# Patient Record
Sex: Male | Born: 1935 | Race: Black or African American | Hispanic: No | Marital: Single
Health system: Southern US, Community
[De-identification: ages and names within clinical notes are randomized; demographics above are authoritative.]

---

## 2012-03-28 ENCOUNTER — Inpatient Hospital Stay: Payer: Self-pay | Admitting: Internal Medicine

## 2012-03-28 LAB — BASIC METABOLIC PANEL
BUN: 27 mg/dL — ABNORMAL HIGH (ref 7–18)
Calcium, Total: 9.5 mg/dL (ref 8.5–10.1)
Chloride: 106 mmol/L (ref 98–107)
Co2: 30 mmol/L (ref 21–32)
EGFR (African American): 41 — ABNORMAL LOW
EGFR (Non-African Amer.): 35 — ABNORMAL LOW
Osmolality: 291 (ref 275–301)

## 2012-03-28 LAB — HEPATIC FUNCTION PANEL A (ARMC)
Albumin: 3.4 g/dL (ref 3.4–5.0)
Alkaline Phosphatase: 94 U/L (ref 50–136)
Bilirubin, Direct: 0.2 mg/dL (ref 0.00–0.20)
SGOT(AST): 74 U/L — ABNORMAL HIGH (ref 15–37)
SGPT (ALT): 101 U/L — ABNORMAL HIGH
Total Protein: 7.1 g/dL (ref 6.4–8.2)

## 2012-03-28 LAB — CBC
HGB: 14.3 g/dL (ref 13.0–18.0)
MCV: 97 fL (ref 80–100)
WBC: 4.7 10*3/uL (ref 3.8–10.6)

## 2012-03-28 LAB — CK TOTAL AND CKMB (NOT AT ARMC)
CK, Total: 121 U/L (ref 35–232)
CK-MB: 4.6 ng/mL — ABNORMAL HIGH (ref 0.5–3.6)

## 2012-03-28 LAB — TROPONIN I
Troponin-I: 0.3 ng/mL — ABNORMAL HIGH
Troponin-I: 0.31 ng/mL — ABNORMAL HIGH

## 2012-03-28 LAB — PRO B NATRIURETIC PEPTIDE: B-Type Natriuretic Peptide: 14664 pg/mL — ABNORMAL HIGH (ref 0–450)

## 2012-03-29 LAB — BASIC METABOLIC PANEL
BUN: 27 mg/dL — ABNORMAL HIGH (ref 7–18)
Calcium, Total: 8.8 mg/dL (ref 8.5–10.1)
Chloride: 107 mmol/L (ref 98–107)
Creatinine: 1.84 mg/dL — ABNORMAL HIGH (ref 0.60–1.30)
Glucose: 102 mg/dL — ABNORMAL HIGH (ref 65–99)
Osmolality: 292 (ref 275–301)
Potassium: 3.7 mmol/L (ref 3.5–5.1)
Sodium: 144 mmol/L (ref 136–145)

## 2012-03-29 LAB — CBC WITH DIFFERENTIAL/PLATELET
Basophil %: 1.5 %
Eosinophil #: 0.3 10*3/uL (ref 0.0–0.7)
Eosinophil %: 5.7 %
HCT: 42.8 % (ref 40.0–52.0)
HGB: 13.9 g/dL (ref 13.0–18.0)
Lymphocyte #: 1.2 10*3/uL (ref 1.0–3.6)
MCH: 31.7 pg (ref 26.0–34.0)
MCHC: 32.5 g/dL (ref 32.0–36.0)
Monocyte #: 0.5 x10 3/mm (ref 0.2–1.0)
Monocyte %: 11.6 %
Neutrophil #: 2.5 10*3/uL (ref 1.4–6.5)
Neutrophil %: 54.5 %
Platelet: 135 10*3/uL — ABNORMAL LOW (ref 150–440)
RBC: 4.39 10*6/uL — ABNORMAL LOW (ref 4.40–5.90)

## 2012-03-29 LAB — CK TOTAL AND CKMB (NOT AT ARMC)
CK, Total: 89 U/L (ref 35–232)
CK-MB: 3.6 ng/mL (ref 0.5–3.6)

## 2012-03-29 LAB — TROPONIN I: Troponin-I: 0.28 ng/mL — ABNORMAL HIGH

## 2012-03-30 LAB — BASIC METABOLIC PANEL
Anion Gap: 8 (ref 7–16)
Co2: 31 mmol/L (ref 21–32)
EGFR (African American): 43 — ABNORMAL LOW
Osmolality: 291 (ref 275–301)
Potassium: 3.5 mmol/L (ref 3.5–5.1)

## 2012-10-18 ENCOUNTER — Ambulatory Visit: Payer: Self-pay | Admitting: Internal Medicine

## 2012-10-19 LAB — CBC
HCT: 45.3 % (ref 40.0–52.0)
HGB: 15.2 g/dL (ref 13.0–18.0)
MCH: 32.7 pg (ref 26.0–34.0)
MCHC: 33.6 g/dL (ref 32.0–36.0)
MCV: 97 fL (ref 80–100)
Platelet: 115 10*3/uL — ABNORMAL LOW (ref 150–440)
RBC: 4.65 10*6/uL (ref 4.40–5.90)
RDW: 16.6 % — ABNORMAL HIGH (ref 11.5–14.5)
WBC: 3.5 10*3/uL — ABNORMAL LOW (ref 3.8–10.6)

## 2012-10-19 LAB — CK TOTAL AND CKMB (NOT AT ARMC): CK-MB: 11.3 ng/mL — ABNORMAL HIGH (ref 0.5–3.6)

## 2012-10-19 LAB — COMPREHENSIVE METABOLIC PANEL
Albumin: 3.1 g/dL — ABNORMAL LOW (ref 3.4–5.0)
Alkaline Phosphatase: 107 U/L (ref 50–136)
Anion Gap: 7 (ref 7–16)
Bilirubin,Total: 0.9 mg/dL (ref 0.2–1.0)
Calcium, Total: 8.9 mg/dL (ref 8.5–10.1)
Chloride: 105 mmol/L (ref 98–107)
Co2: 26 mmol/L (ref 21–32)
EGFR (African American): 33 — ABNORMAL LOW
Osmolality: 286 (ref 275–301)
Potassium: 4.5 mmol/L (ref 3.5–5.1)
SGPT (ALT): 84 U/L — ABNORMAL HIGH (ref 12–78)
Sodium: 138 mmol/L (ref 136–145)
Total Protein: 6.2 g/dL — ABNORMAL LOW (ref 6.4–8.2)

## 2012-10-19 LAB — PRO B NATRIURETIC PEPTIDE: B-Type Natriuretic Peptide: 19878 pg/mL — ABNORMAL HIGH (ref 0–450)

## 2012-10-19 LAB — TROPONIN I: Troponin-I: 0.09 ng/mL — ABNORMAL HIGH

## 2012-10-20 ENCOUNTER — Inpatient Hospital Stay: Payer: Self-pay | Admitting: Specialist

## 2012-10-20 LAB — BASIC METABOLIC PANEL
BUN: 31 mg/dL — ABNORMAL HIGH (ref 7–18)
Calcium, Total: 8.4 mg/dL — ABNORMAL LOW (ref 8.5–10.1)
Chloride: 108 mmol/L — ABNORMAL HIGH (ref 98–107)
Co2: 25 mmol/L (ref 21–32)
Creatinine: 1.99 mg/dL — ABNORMAL HIGH (ref 0.60–1.30)
Glucose: 178 mg/dL — ABNORMAL HIGH (ref 65–99)
Sodium: 141 mmol/L (ref 136–145)

## 2012-10-20 LAB — PROTIME-INR
INR: 2.2
Prothrombin Time: 24.3 secs — ABNORMAL HIGH (ref 11.5–14.7)

## 2012-10-20 LAB — TROPONIN I: Troponin-I: 0.09 ng/mL — ABNORMAL HIGH

## 2012-10-21 LAB — COMPREHENSIVE METABOLIC PANEL
Alkaline Phosphatase: 79 U/L (ref 50–136)
BUN: 32 mg/dL — ABNORMAL HIGH (ref 7–18)
Bilirubin,Total: 0.7 mg/dL (ref 0.2–1.0)
Calcium, Total: 8.7 mg/dL (ref 8.5–10.1)
Chloride: 109 mmol/L — ABNORMAL HIGH (ref 98–107)
Co2: 26 mmol/L (ref 21–32)
Creatinine: 1.91 mg/dL — ABNORMAL HIGH (ref 0.60–1.30)
EGFR (African American): 39 — ABNORMAL LOW
EGFR (Non-African Amer.): 33 — ABNORMAL LOW
Glucose: 102 mg/dL — ABNORMAL HIGH (ref 65–99)
Osmolality: 290 (ref 275–301)
Total Protein: 5.1 g/dL — ABNORMAL LOW (ref 6.4–8.2)

## 2012-10-22 LAB — BASIC METABOLIC PANEL
Anion Gap: 7 (ref 7–16)
BUN: 31 mg/dL — ABNORMAL HIGH (ref 7–18)
Calcium, Total: 8.6 mg/dL (ref 8.5–10.1)
Chloride: 107 mmol/L (ref 98–107)
Creatinine: 1.92 mg/dL — ABNORMAL HIGH (ref 0.60–1.30)
EGFR (African American): 38 — ABNORMAL LOW
Osmolality: 290 (ref 275–301)
Sodium: 142 mmol/L (ref 136–145)

## 2012-10-22 LAB — PROTIME-INR
INR: 2.6
Prothrombin Time: 28.1 secs — ABNORMAL HIGH (ref 11.5–14.7)

## 2012-10-23 LAB — BASIC METABOLIC PANEL
Anion Gap: 6 — ABNORMAL LOW (ref 7–16)
Creatinine: 1.98 mg/dL — ABNORMAL HIGH (ref 0.60–1.30)
EGFR (African American): 37 — ABNORMAL LOW
EGFR (Non-African Amer.): 32 — ABNORMAL LOW
Potassium: 4.8 mmol/L (ref 3.5–5.1)

## 2012-10-23 LAB — PROTIME-INR: Prothrombin Time: 25 secs — ABNORMAL HIGH (ref 11.5–14.7)

## 2012-10-29 ENCOUNTER — Inpatient Hospital Stay: Payer: Self-pay | Admitting: Internal Medicine

## 2012-10-29 LAB — CBC WITH DIFFERENTIAL/PLATELET
Basophil #: 0 10*3/uL (ref 0.0–0.1)
Basophil %: 0.6 %
Eosinophil %: 1.1 %
HCT: 48.6 % (ref 40.0–52.0)
HGB: 16.1 g/dL (ref 13.0–18.0)
Lymphocyte #: 0.6 10*3/uL — ABNORMAL LOW (ref 1.0–3.6)
Lymphocyte %: 16.7 %
MCH: 32.2 pg (ref 26.0–34.0)
MCHC: 33.2 g/dL (ref 32.0–36.0)
MCV: 97 fL (ref 80–100)
Neutrophil #: 2.7 10*3/uL (ref 1.4–6.5)
Platelet: 105 10*3/uL — ABNORMAL LOW (ref 150–440)
RBC: 5.01 10*6/uL (ref 4.40–5.90)
WBC: 3.9 10*3/uL (ref 3.8–10.6)

## 2012-10-29 LAB — COMPREHENSIVE METABOLIC PANEL
Albumin: 3.6 g/dL (ref 3.4–5.0)
Alkaline Phosphatase: 124 U/L (ref 50–136)
Anion Gap: 10 (ref 7–16)
BUN: 43 mg/dL — ABNORMAL HIGH (ref 7–18)
Bilirubin,Total: 1.8 mg/dL — ABNORMAL HIGH (ref 0.2–1.0)
Chloride: 99 mmol/L (ref 98–107)
Creatinine: 2.74 mg/dL — ABNORMAL HIGH (ref 0.60–1.30)
EGFR (African American): 25 — ABNORMAL LOW
Glucose: 139 mg/dL — ABNORMAL HIGH (ref 65–99)
SGOT(AST): 182 U/L — ABNORMAL HIGH (ref 15–37)
Sodium: 134 mmol/L — ABNORMAL LOW (ref 136–145)
Total Protein: 7 g/dL (ref 6.4–8.2)

## 2012-10-29 LAB — CK TOTAL AND CKMB (NOT AT ARMC)
CK, Total: 1312 U/L — ABNORMAL HIGH (ref 35–232)
CK-MB: 17 ng/mL — ABNORMAL HIGH (ref 0.5–3.6)

## 2012-10-29 LAB — TROPONIN I: Troponin-I: 1.2 ng/mL — ABNORMAL HIGH

## 2012-10-29 LAB — PROTIME-INR: Prothrombin Time: 41.5 secs — ABNORMAL HIGH (ref 11.5–14.7)

## 2012-10-30 LAB — CBC WITH DIFFERENTIAL/PLATELET
Basophil %: 0.6 %
HCT: 43.5 % (ref 40.0–52.0)
HGB: 14.7 g/dL (ref 13.0–18.0)
Lymphocyte #: 0.7 10*3/uL — ABNORMAL LOW (ref 1.0–3.6)
Lymphocyte %: 20.1 %
MCHC: 33.7 g/dL (ref 32.0–36.0)
MCV: 97 fL (ref 80–100)
Monocyte %: 17.5 %
Neutrophil #: 2.2 10*3/uL (ref 1.4–6.5)
Neutrophil %: 61.1 %
RBC: 4.51 10*6/uL (ref 4.40–5.90)
RDW: 16.3 % — ABNORMAL HIGH (ref 11.5–14.5)
WBC: 3.6 10*3/uL — ABNORMAL LOW (ref 3.8–10.6)

## 2012-10-30 LAB — BASIC METABOLIC PANEL
Calcium, Total: 9.1 mg/dL (ref 8.5–10.1)
Chloride: 101 mmol/L (ref 98–107)
EGFR (African American): 25 — ABNORMAL LOW
EGFR (Non-African Amer.): 22 — ABNORMAL LOW
Glucose: 112 mg/dL — ABNORMAL HIGH (ref 65–99)
Potassium: 4.5 mmol/L (ref 3.5–5.1)
Sodium: 136 mmol/L (ref 136–145)

## 2012-10-30 LAB — CK TOTAL AND CKMB (NOT AT ARMC): CK-MB: 13.7 ng/mL — ABNORMAL HIGH (ref 0.5–3.6)

## 2012-10-30 LAB — PROTIME-INR: INR: 4.4

## 2012-10-31 LAB — BASIC METABOLIC PANEL
EGFR (African American): 26 — ABNORMAL LOW
EGFR (Non-African Amer.): 22 — ABNORMAL LOW
Potassium: 4.1 mmol/L (ref 3.5–5.1)
Sodium: 138 mmol/L (ref 136–145)

## 2012-10-31 LAB — DIGOXIN LEVEL: Digoxin: 0.83 ng/mL

## 2012-10-31 LAB — PROTIME-INR: Prothrombin Time: 44.6 secs — ABNORMAL HIGH (ref 11.5–14.7)

## 2012-11-04 LAB — CULTURE, BLOOD (SINGLE)

## 2012-11-18 ENCOUNTER — Ambulatory Visit: Payer: Self-pay | Admitting: Hospice and Palliative Medicine

## 2012-11-18 DEATH — deceased

## 2013-09-28 IMAGING — US ABDOMEN ULTRASOUND LIMITED
1 series · 13 of 25 positions shown · non-contrast
Comparison: none

REASON FOR EXAM: elevated transaminases
COMMENTS:   Body Site: GB and Fossa, CBD, Head of Pancreas; Right Upper Quad

PROCEDURE:     US  - US ABDOMEN LIMITED SURVEY  - October 21, 2012  [DATE]
RESULT:     Limited right upper quadrant abdominal ultrasound dated 10/21/2012.

[Series 1: abdomen ultrasound limited · 0.21mm/px · 13 of 43 slices shown]
[im 1/43]
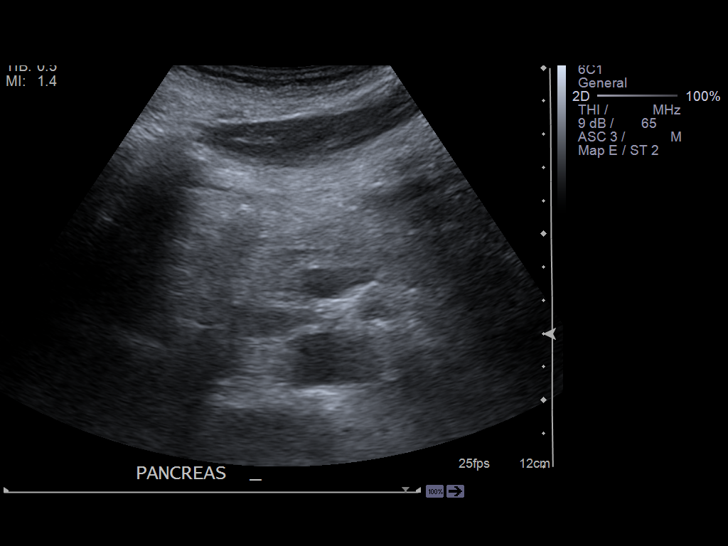
[im 4/43]
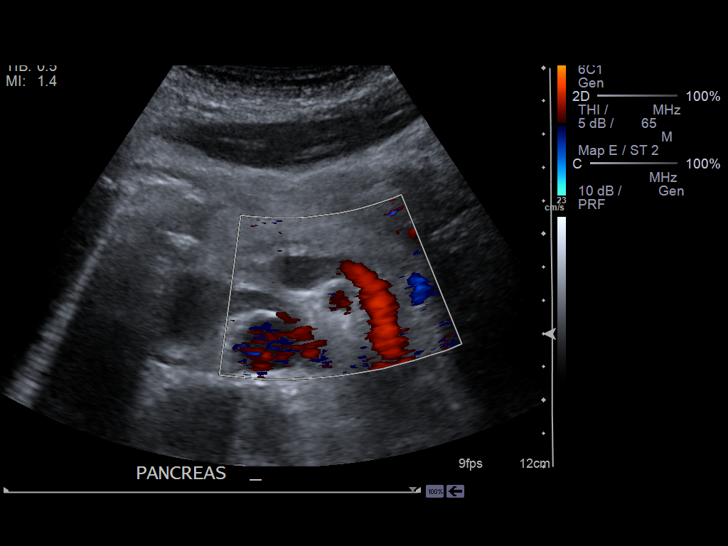
[im 8/43]
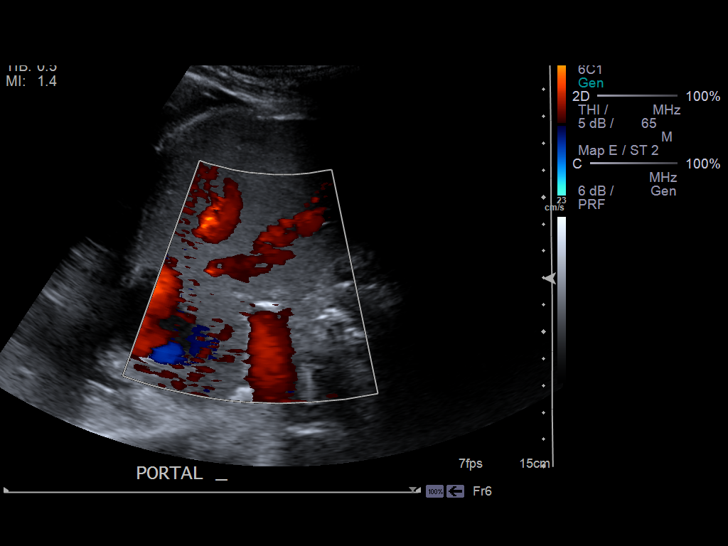
[im 11/43]
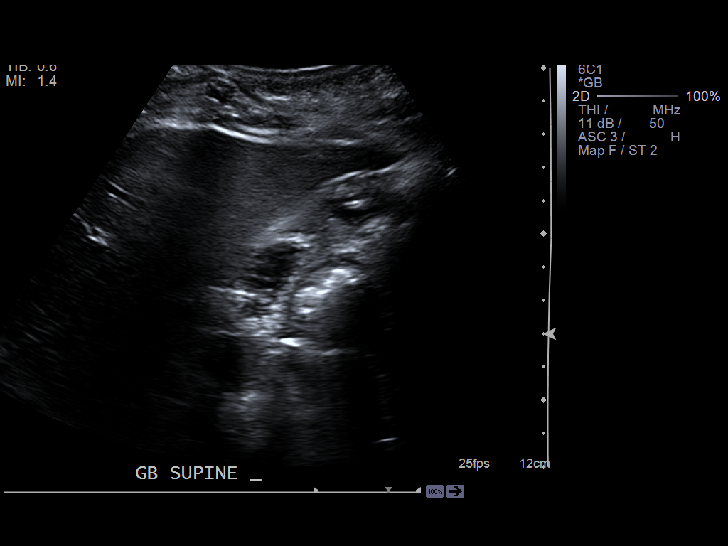
[im 15/43]
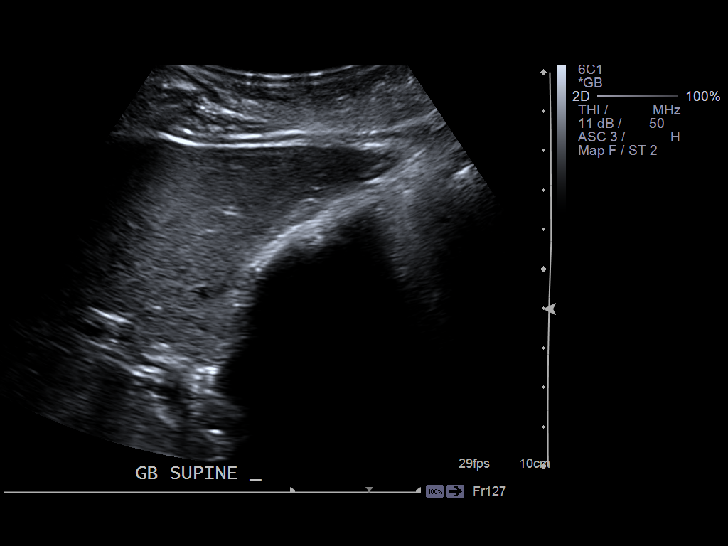
[im 18/43]
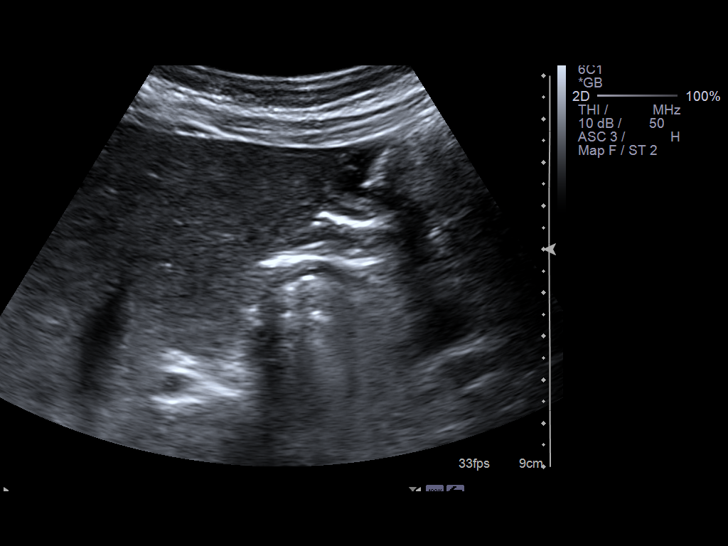
[im 22/43]
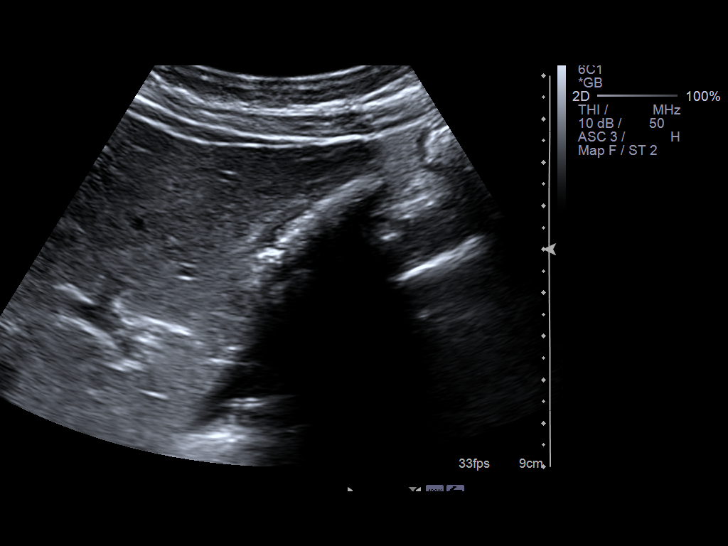
[im 25/43]
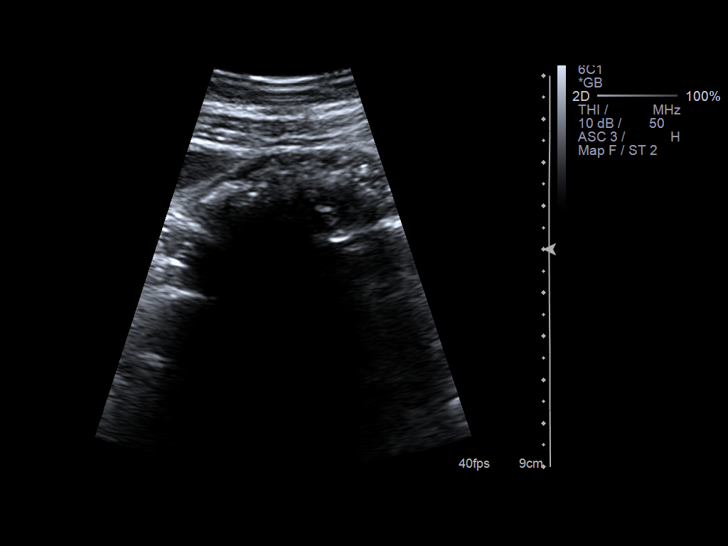
[im 29/43]
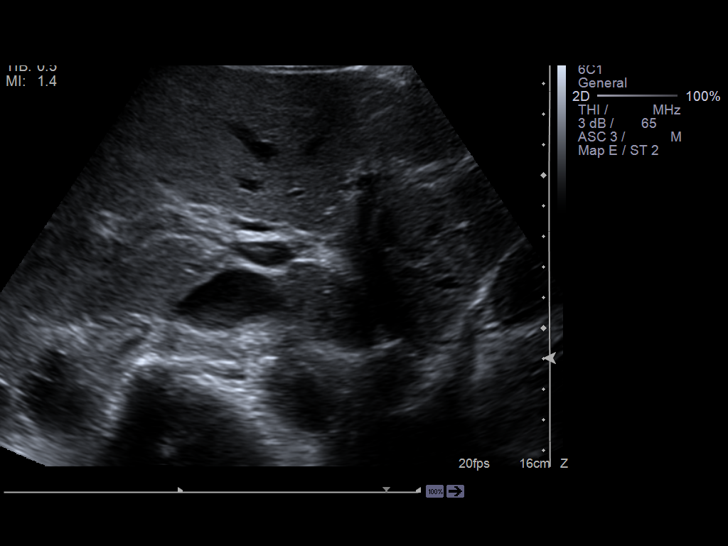
[im 32/43]
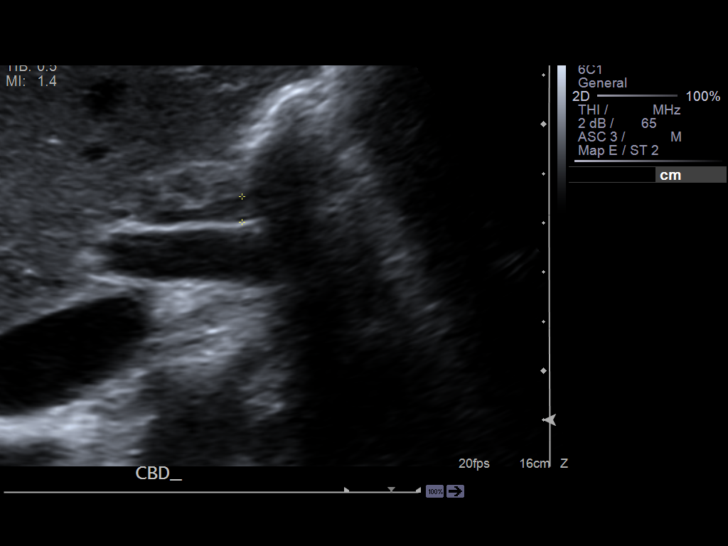
[im 36/43]
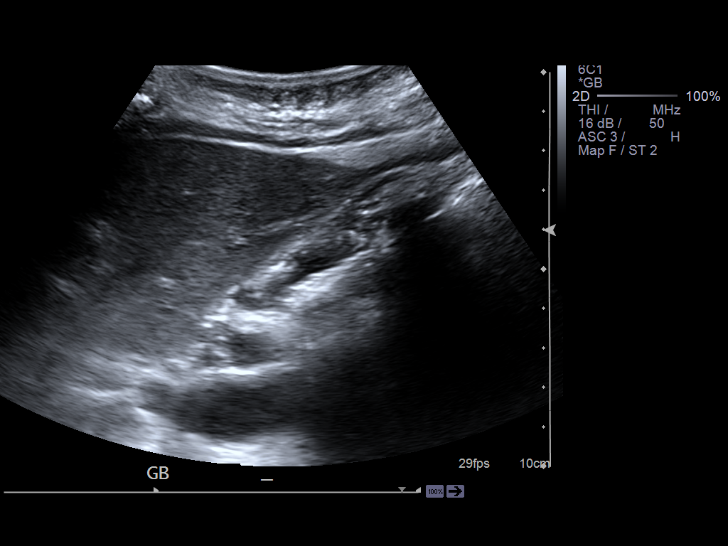
[im 39/43]
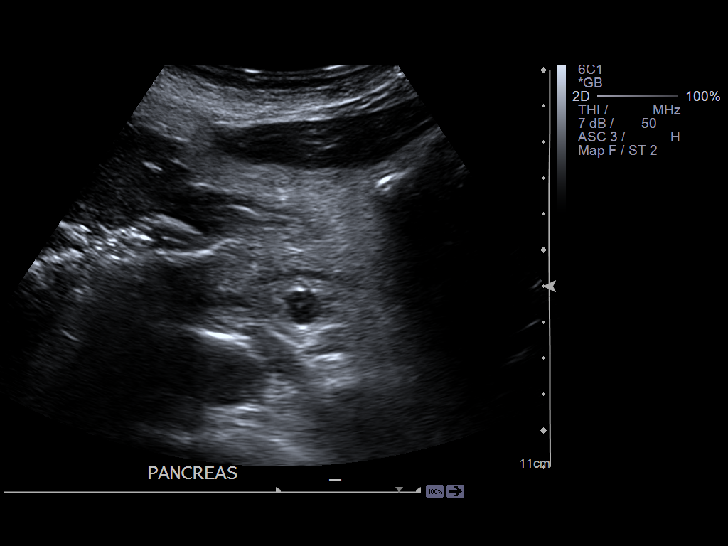
[im 43/43]
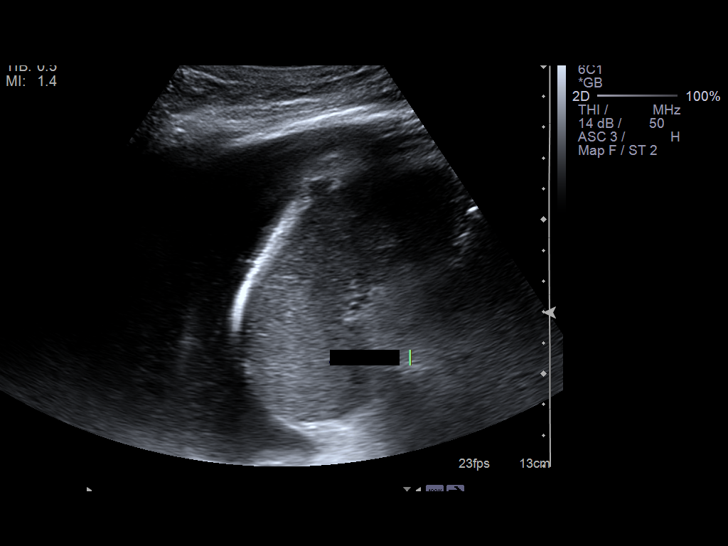

[13 of 25 positions shown; findings below may reference images not displayed]

FINDINGS: Multiple echogenic foci with acoustic shadowing identified within
the gallbladder. These foci do not appear to be mobile. No evidence of a
sonographic Murphy's sign. Gallbladder wall thickness is 3.4 mm. Common bile
that measures 5.2 mm in diameter. There are findings which appear to reflect
 fluid within the gallbladder wall like representing edema. No evidence of
pericholecystic fluid. Hepato- pedal flow is appreciated within the portal
vein. The liver is grossly unremarkable. Visualized portion the pancreas
unremarkable. Bilateral pleural effusions are appreciated.
IMPRESSION: Multiple nonmobile gallstones within the gallbladder as
well as findings consistent gallbladder wall thickening secondary to edema.
These findings are equivocal and clinical correlation recommended,
particularly if the patient has hypoalbuminemia or history of ascites. There
is no evidence of pericholecystic fluid nor a sonographic Murphy's sign.
2. Bilateral pleural effusions

## 2013-10-07 IMAGING — US ABDOMEN ULTRASOUND
1 series · 13 of 25 positions shown · non-contrast
Comparison: none

REASON FOR EXAM: abnormal LFT's
COMMENTS:

PROCEDURE:     US  - US ABDOMEN GENERAL SURVEY  - October 30, 2012  [DATE]
RESULT:     Comparison: None
TECHNIQUE: Multiple gray-scale and color-flow Doppler images of the abdomen
are presented for review.

[Series 1: abdomen ultrasound · 0.30mm/px · 13 of 61 slices shown]
[im 1/61]
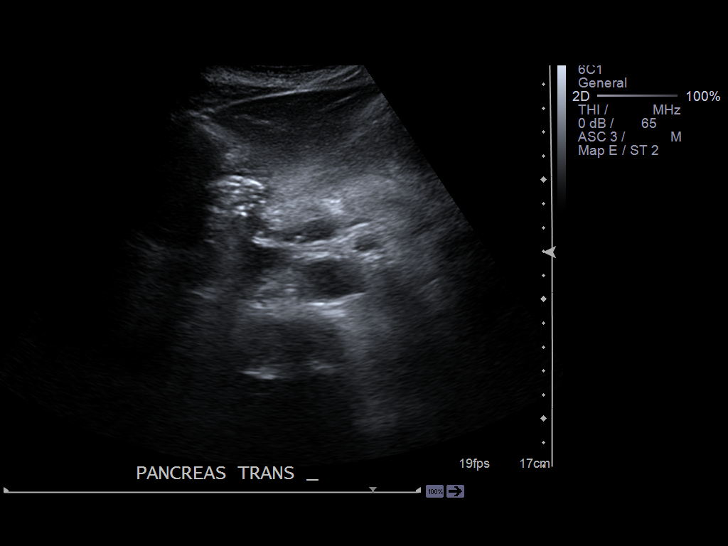
[im 6/61]
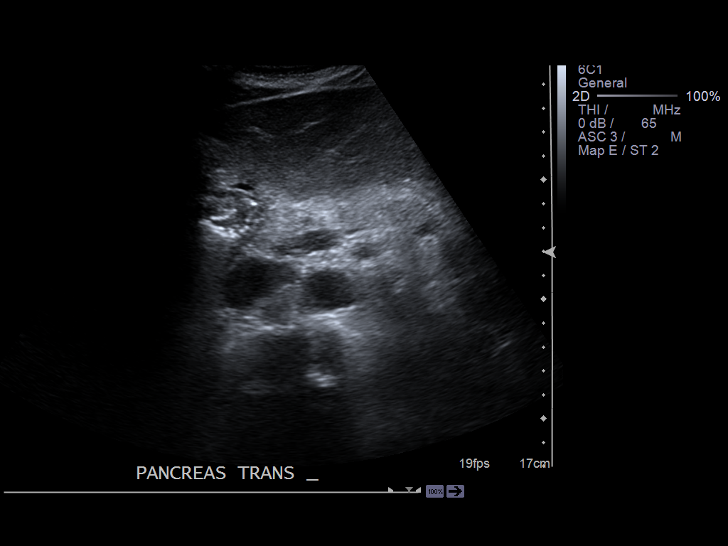
[im 11/61]
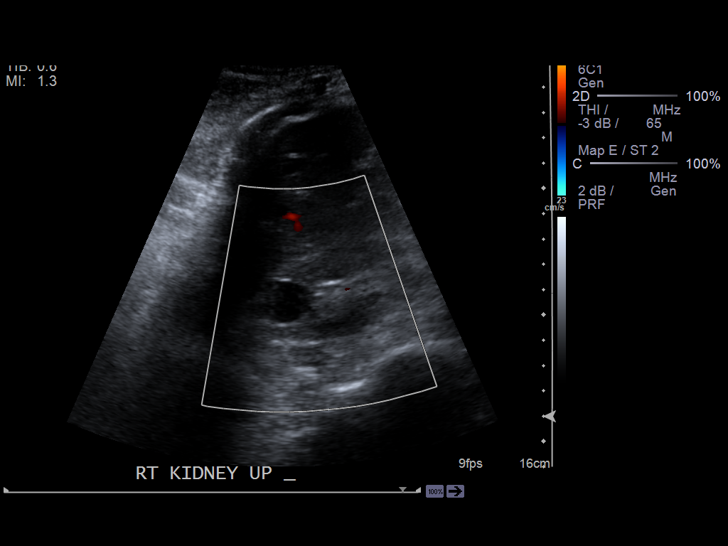
[im 16/61]
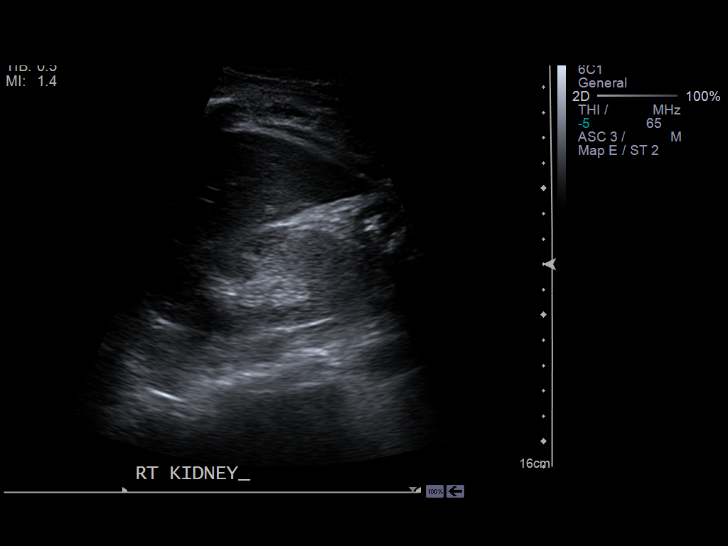
[im 21/61]
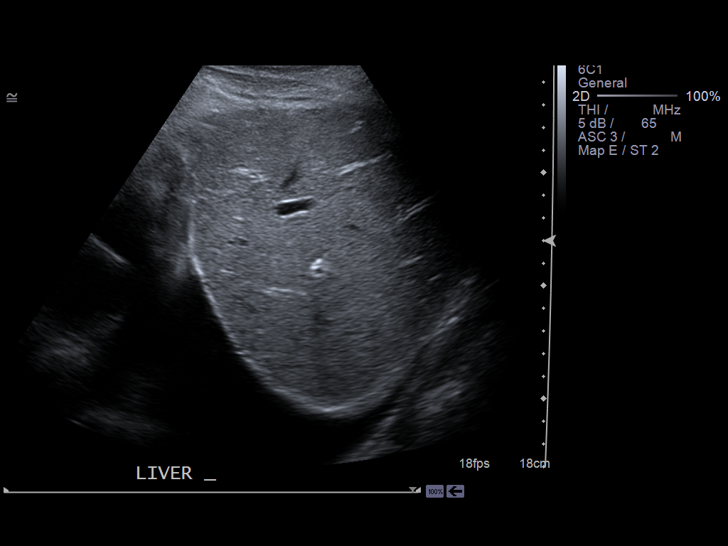
[im 26/61]
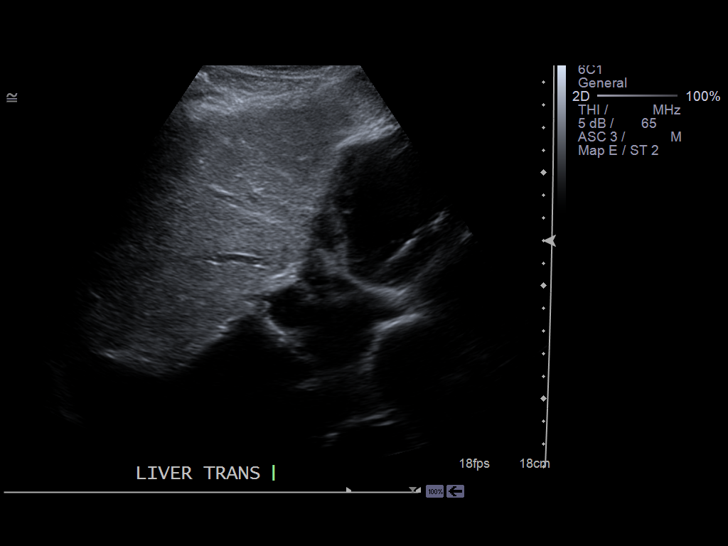
[im 31/61]
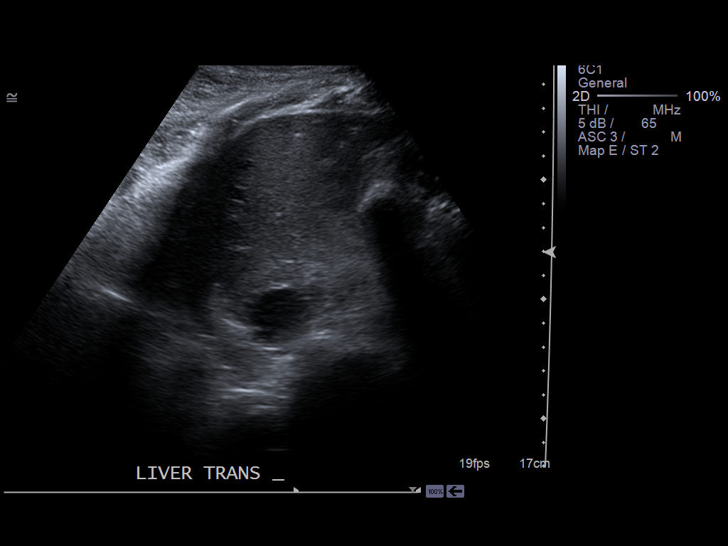
[im 36/61]
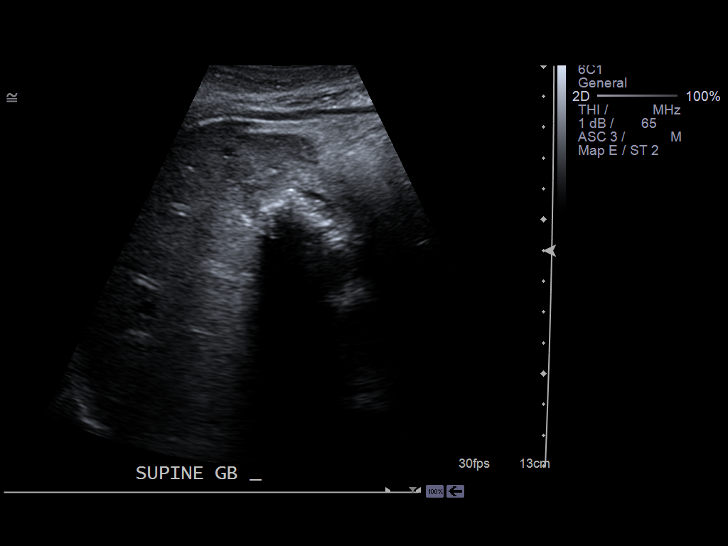
[im 41/61]
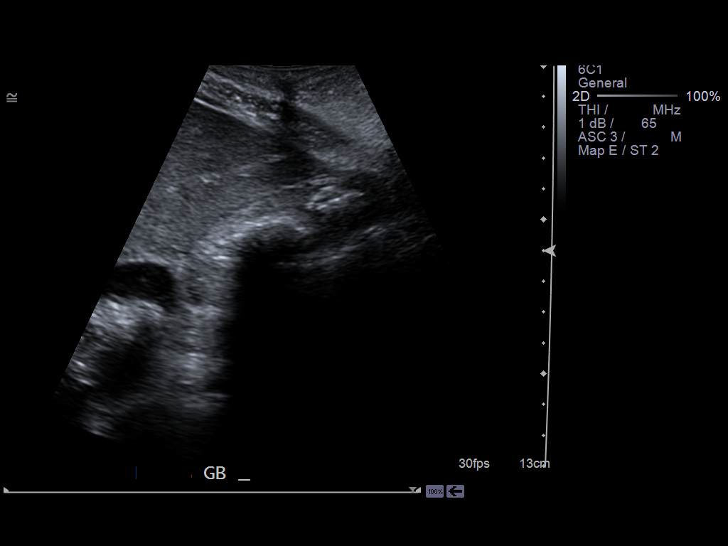
[im 46/61]
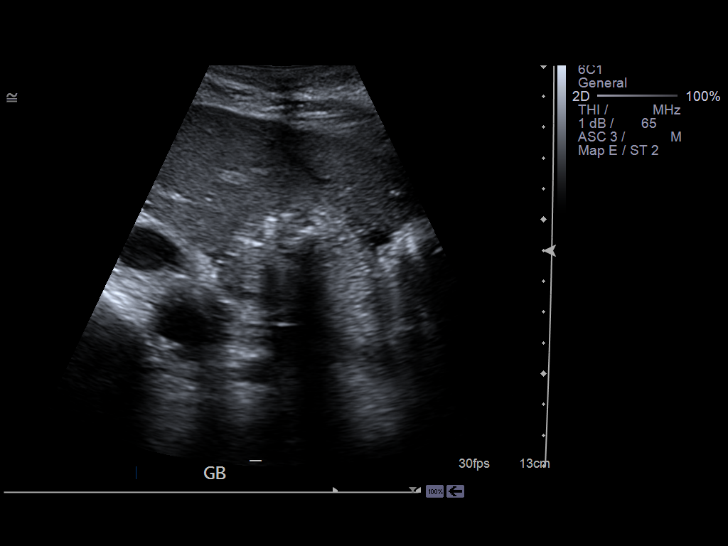
[im 51/61]
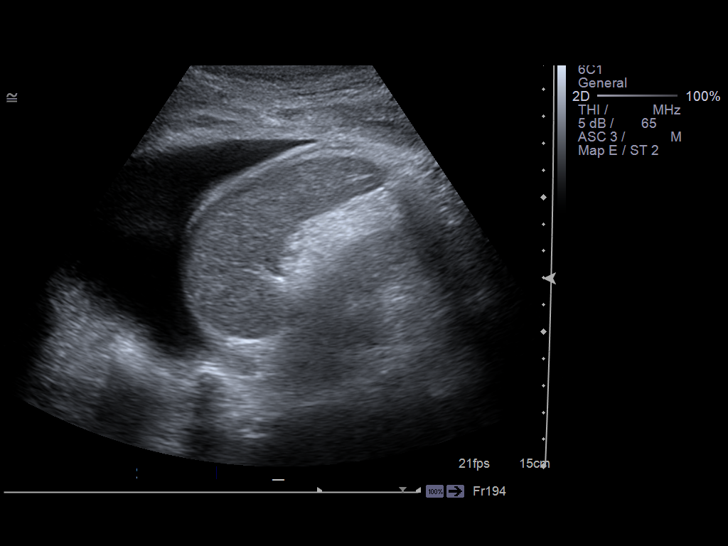
[im 56/61]
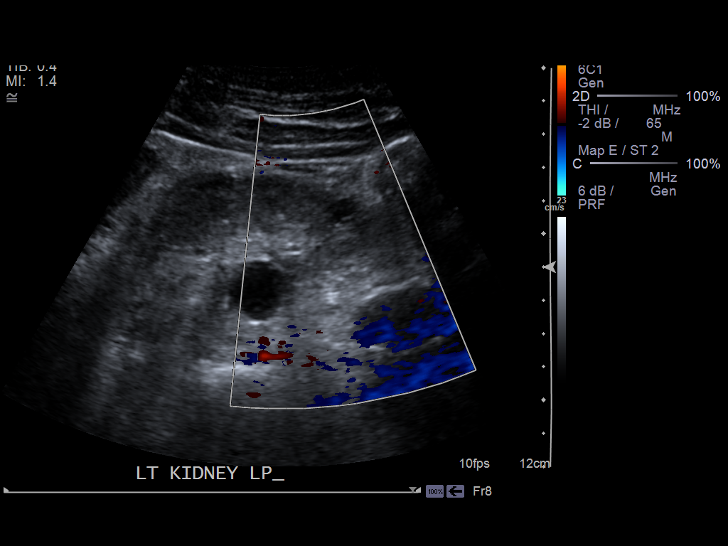
[im 61/61]
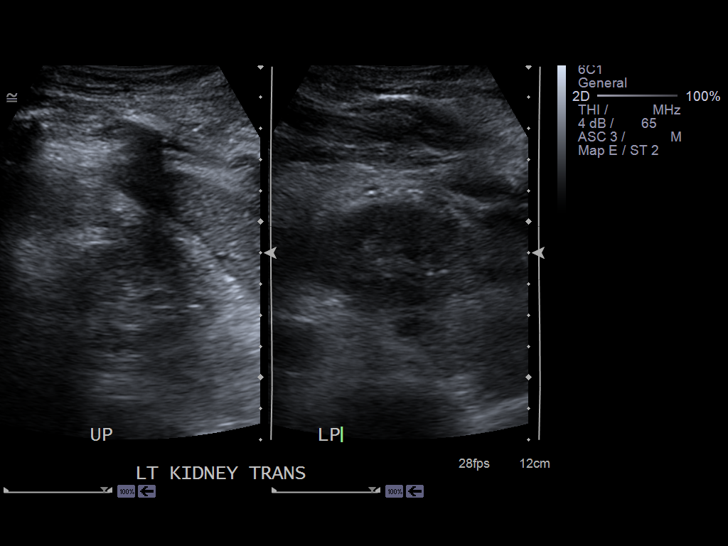

[13 of 25 positions shown; findings below may reference images not displayed]

FINDINGS: Visualized portions of the liver demonstrate normal echogenicity and normal
contours. The liver is without evidence of a focal hepatic lesion.

There are cholelithiasis. There is no intra- or extrahepatic biliary ductal
dilatation. The common duct measures 5.2 mm in maximal diameter. There is no
gallbladder wall thickening, pericholecystic fluid, or sonographic Murphy's
sign.

The visualized portion of the pancreas is normal in echogenicity. The spleen
is unremarkable. Bilateral kidneys are increased in echogenicity and
diminutive in size. The right kidney measures 8.8 x 5.2 x 4.4 cm. The left
kidney measures 8.7 x 5.2 x 4.9 cm. There are no renal calculi or
hydronephrosis. There bilateral anechoic a mass is with the largest on the
right measuring 3.1 cm and the largest on the left measuring 2.3 cm most
consistent with cysts. The abdominal aorta and IVC are unremarkable.

There are bilateral pleural effusions.
IMPRESSION: 1. Cholelithiasis with mild gallbladder wall thickening. This appearance can
be seen with cholecystitis versus in the presence of hepatocellular disease.
Correlate with clinical history. If there is further clinical concern
recommend a HIDA scan.

2. Bilateral pleural effusions.

[REDACTED]

## 2015-02-07 NOTE — H&P (Signed)
PATIENT NAME:  [name], [name] MR#:  [id number] DATE OF BIRTH:  Mar 22, 1934  DATE OF ADMISSION:  10/19/2012  CHIEF COMPLAINT: Shortness of breath.   HISTORY OF PRESENT ILLNESS: This is a 81 year old male who has a history of cardiomyopathy with an ejection fraction of 10%. He said for the past 4 days he has been getting progressively more short of breath, complained of some orthopnea symptoms, comes in and his chest x-ray looks like congestive heart failure with the possibility of some underlying pneumonia. He has had no fever, no chills, no cough symptoms, just the shortness of breath. He denies any chest pain.   PAST MEDICAL HISTORY: 1. Cardiomyopathy with an ejection fraction of 10%.  2. Hypertension.  3. Stage III chronic kidney disease.  4. Atrial fibrillation, on chronic anticoagulation.  5. Hyperlipidemia.  6. Mitral regurgitation.  7. History of ischemic stroke.  8. Erectile dysfunction.   PAST SURGICAL HISTORY: Pacemaker placement back in June 2013.   ALLERGIES: No known drug allergies.   CURRENT MEDICATIONS: Aldactone 25 mg q. day, Atacand 2 mg q. day, Cialis 5 mg q. day, Coreg 6.25 mg b.i.d., Coumadin 2.5 mg q. day, Lasix 40 mg q. day, Lipitor 80 mg q. day.   SOCIAL HISTORY: Does not smoke, does not drink alcohol.   FAMILY HISTORY: Denies any coronary artery disease.   PHYSICAL EXAMINATION: VITAL SIGNS: Temperature 97.2, pulse 72, respirations 22, blood pressure 92/70.  GENERAL: This is a well-nourished black male in no acute distress.  HEENT: Pupils are equal, round, and reactive to light. Sclerae are anicteric. Oral mucosa is moist. Oropharynx is clear. Nasopharynx is clear.  NECK: Supple. No JVD, lymphadenopathy. No thyromegaly.  CARDIOVASCULAR: Irregularly irregular with no murmurs.  LUNGS: Some scattered crackles. No dullness to percussion. He is not using accessory muscles.  ABDOMEN: Soft, nontender, and nondistended. Bowel sounds are positive. No hepatosplenomegaly.  No masses.  EXTREMITIES: There is no edema. No joint deformity.  NEUROLOGICAL: Cranial nerves II through XII are intact. He is alert and oriented x 4.  SKIN: Moist with no rash.   LABORATORY, DIAGNOSTIC AND RADIOLOGICAL DATA:  BNP is 19,878. White blood cells are 3.5, hemoglobin is 15.2, BUN is 30, creatinine is 2.16, sodium 138, potassium 4.5. INR is 1.8. Troponin is 0.09.   Chest x-ray looks like bilateral edema. Radiology read it out as possible bilateral lower lobe pneumonia versus atelectasis with small bilateral effusions.   ASSESSMENT AND PLAN: 1. Acute systolic congestive heart failure:  We will go ahead and start him on some IV Lasix, continue his other medications. He does have an ejection fraction of 10%. I will see how he responds to this. Since there was some possible suspicion of pneumonia on the chest x-ray, I will go ahead and cover him on some Levaquin. Repeat his chest x-ray in the morning after diuresis to see if there are any residual infiltrates.  I have low suspicion because he has a low white count, and no fever, and no other clinical symptoms; but with such a low ejection fraction, he does not have much reserve, so I would want err on the side of caution.  2. Acute on chronic renal failure: I suspect this is from poor perfusion. His creatinine will likely get better with diuresis, but we will monitor this closely.  3. Elevated troponin: I suspect some demand ischemia from the strain. He is already anticoagulated. We will continue his current medications which include a beta blocker.  I will consult  Cardiology to see if his troponin bumps further. We may need further work-up.  4. Cardiomyopathy: I will continue his current medications.  5. Atrial fibrillation:  Rate is controlled. He is anticoagulated.        TIME SPENT ON ADMISSION: 45 minutes.  ____________________________ Gracelyn Nurse, MD jdj:cb D: 10/19/2012 17:32:43 ET T: 10/19/2012 17:51:56  ET JOB#: [id number]  cc: Gracelyn Nurse, MD, <Dictator> Laverda Sorenson, MD in Octavio Manns, MD Gracelyn Nurse MD ELECTRONICALLY SIGNED 10/19/2012 21:19

## 2015-02-07 NOTE — Consult Note (Signed)
PATIENT NAME:  [name], [name] MR#:  [id number] DATE OF BIRTH:  [birth year]  DATE OF CONSULTATION:  10/29/2012  REFERRING PHYSICIAN: Dr. Judithann SheenSparks.  CONSULTING PHYSICIAN:  Verta EllenMonica A. Macio Kissoon, PA-C  PRIMARY CARE PHYSICIAN: [facility] [place], Dr. Wallene Huharew.   REASON FOR CONSULTATION: Congestive heart failure with cardiomyopathy and chronic atrial fibrillation.   HISTORY OF PRESENT ILLNESS: The patient is a 81 year old African American male, who is well known to our practice. He has a history of chronic atrial fibrillation on anticoagulation with warfarin, cardiomyopathy with last ejection fraction reported at 18%, chronic stage III kidney disease and mitral regurgitation. The patient was recently admitted on 01/06 with hypotension and acute decompensated congestive heart failure. He was given diuretics at that time. Due to his hypotension, his carvedilol dose was decreased.   The patient notes that over the week and he had increased shortness of breath and was unable to even walk short distances and thus came to the Emergency Department. He denies any associated chest pain, chest pressure, jaw pain, left arm pain, pedal edema, orthopnea or PND. In the Emergency Room, he had hypoxia, chest x-ray consistent with congestive heart failure and a BNP elevated greater than 50,000.   PAST MEDICAL HISTORY:  1.  Paroxysmal atrial fibrillation, on anticoagulation with warfarin.  2.  History of nonsustained ventricular tachycardia.  3.  Cardiomyopathy.  4.  Mitral regurgitation.  5.  Hypertension.  6.  Hyperlipidemia.  7.  Ischemic stroke.  8.  Chronic kidney disease, stage III.  9.  Rheumatic heart disease.  10. Last cardiac catheterization 06/28/2011 with minor irregularities in the LAD and circumflex, done by [facility].  11. Erectile dysfunction.  PAST SURGICAL HISTORY: [facility] [employer]  model [id number], serial [id number], placed on 04/23/2012.   ALLERGIES: No  known drug allergies.   HOME MEDICATIONS: Prior to admission: 1.  Aldactone 25 mg p.o. daily.  2.  Carvedilol 3.125 p.o. b.i.d.  3.  Cialis 5 mg use as directed.  4.  Warfarin 2.5 mg p.o. daily.  5.  Lasix 20 mg p.o. daily.  6.  Lipitor 80 mg 1 tablet p.o. at bedtime.  7.  Ranexa 500 mg p.o. b.i.d.   SOCIAL HISTORY: The patient does not use any alcohol, tobacco or illicit drugs. He is a former smoker.   FAMILY HISTORY: Negative for coronary artery disease and noncontributory.  REVIEW OF SYSTEMS:   CONSTITUTIONAL: The patient denies any fever, chills or weight loss.  EYES: The patient denies any blurry vision, double vision or glaucoma.  ENT: The patient denies tinnitus and hearing loss.  RESPIRATORY: The patient complains of shortness of breath. Denies coughing, wheezing or orthopnea.  CARDIOVASCULAR: The patient denies any chest pain or palpitations.  GASTROINTESTINAL: The patient denies abdominal pain, nausea or vomiting.   PHYSICAL EXAMINATION:  GENERAL: The patient is alert and oriented. He is lying up in bed. He says he feels fine other than the shortness of breath.  VITAL SIGNS: Temperature 97 degrees Fahrenheit, heart rate 63, respiratory rate 18, blood pressure 107/73, oxygen saturation 98% on 1 L per minute of nasal cannula.  HEENT: Head atraumatic, normocephalic. Eyes: Pupils are round, equal. There is no scleral icterus. Conjunctivae pale, pink. Ears and nose are normal to external inspection. Mouth good dentition. Some dry mucous membranes.  NECK: Supple. Trachea is midline. JVD noted. No carotid bruits noted.  LUNGS: The patient has crackles present bilaterally. No wheezes or rhonchi. No accessory  muscle use at this time.  CARDIOVASCULAR: Irregular regular rhythm, slightly tachycardic. The patient has a murmur that is louder over the mitral area.  ABDOMEN: Soft, nontender, nondistended. Bowel sounds are present.  EXTREMITIES: No evidence of edema of edema, although  extremities are cool to the touch.   ANCILLARY DATA:   LABORATORY DATA: Glucose 102, BUN 46, creatinine 2.70, sodium 136, potassium 4.5, chloride 101, CO2 25. Estimated GFR 25. Osmolality 285. Total protein 7.0, albumin 3.6, bilirubin 1.8, alkaline phosphatase 124, AST 182, ALT 136. Total CK 1312, CK-MB is 17.0, initial troponin is 1.4, second troponin is 1.20. White blood cell count 3.6, hemoglobin 14.7, hematocrit 43.5, platelet count 86,000. PT is 41.6. INR is 4.4. Blood gas with pH 7.43, pCO2 29, pO2 336, bicarbonate is 19.2.  RADIOLOGY:  1.  Chest x-ray from 10/29/2012 findings consistent with congestive heart failure, cannot exclude bibasilar atelectasis or pneumonia. Deterioration appearance of the chest since the earlier study.  2.  Outpatient echocardiogram done on 10/26/2012 with moderate 4 chamber dilatation, severe left ventricular systolic dysfunction with ejection fraction of approximately 18%, severe global hypokinesis, mild-to-moderate left ventricular hypertrophy, severe diastolic dysfunction, mild pulmonary regurgitation, moderate tricuspid regurgitation, severe pulmonary hypertension, moderate-to-severe mitral regurgitation, trivial right pleural effusion and large left pleural effusion and a pacer wire noted in the right heart.   ASSESSMENT/PLAN:  1.  Decompensated congestive heart failure/cardiomyopathy. The patient has a history of dilated cardiomyopathy with an ejection fraction of 18% on an echocardiogram done in our office on 10/26/2012. He also has severe diastolic dysfunction and significant valvular disease. He has been given Lasix with some improvement in symptoms and his breathing has improved. The patient's carvedilol has been increased to 6.25 twice daily and he has been able to tolerate without any hypotension. He is already on Digoxin 0.125.  2.  Elevated cardiac enzymes. The patient denies any chest pain or chest pressure at this time and his elevations in cardiac  enzymes could be from ischemia versus decreased renal clearance as he has chronic kidney disease, stage III with an estimated GFR of approximately 25. Due to this fact, we will defer cardiac catheterization. We can get outpatient work-up such as stress test once his acute congestive heart failure has been controlled.  3.  Acute on chronic renal failure. The patient has had nephrology already consulted. We await their further recommendations and appreciate their input.  4.  Chronic/paroxysmal atrial fibrillation. The patient is currently paced at 100% and is on anticoagulation with warfarin.   Thank you very much for this consultation and allowing [place] to participate in this patient's care. We will continue to follow this patient with you.     ____________________________ Verta Ellen, PA-C mam:aw D: 10/30/2012 08:26:14 ET T: 10/30/2012 09:48:17 ET JOB#: [id number]  cc: Verta Ellen, PA-C, <Dictator> DR. Wallene Huh Terese Heier A Joellen Tullos PA ELECTRONICALLY SIGNED 10/30/2012 14:18

## 2015-02-07 NOTE — H&P (Signed)
PATIENT NAME:  [name], [name] MR#:  [id number] DATE OF BIRTH:  1933/12/13  DATE OF ADMISSION:  10/29/2012  REFERRING PHYSICIAN: Dr. Cyril Loosen  FAMILY PHYSICIAN: Dr. Adrian Blackwater  REASON FOR ADMISSION: Acute onset shortness of breath with acute respiratory failure.   HISTORY OF PRESENT ILLNESS: The patient is a 81 year old male with a history of chronic atrial fibrillation, nonischemic cardiomyopathy with an ejection fraction of 10%, and chronic stage III kidney disease. The patient was discharged from this facility with congestive heart failure approximately 6 days ago, re-presents now with acute onset of shortness of breath. He denies chest pain. In the Emergency Room, the patient was found to be hypoxic, requiring oxygen. Chest x-ray showed heart failure with a BNP of greater than 50,000. His renal function was worse. He is now admitted for further evaluation.   PAST MEDICAL HISTORY: 1. Dilated cardiomyopathy with ejection fraction of 10%.  2. Chronic atrial fibrillation, on anticoagulation.  3. Stage III chronic kidney disease.  4. Benign hypertension.  5. Hyperlipidemia.  6. Mitral regurgitation.  7. History of stroke.  8. Erectile dysfunction.  9. Status post pacemaker implant.   MEDICATIONS: 1. Warfarin 2.5 mg p.o. q. p.m.  2. Ranexa 500 mg p.o. daily.  3. Lipitor 80 mg p.o. daily.  4. Lasix 20 mg p.o. daily.  5. Cialis 5 mg p.o. daily as needed.  6. Coreg 3.125 mg p.o. b.i.d.  7. Aldactone 25 mg p.o. daily.  ALLERGIES: No known drug allergies.   SOCIAL HISTORY: Negative for alcohol or tobacco abuse.   FAMILY HISTORY: Negative for coronary artery disease. Negative for prostate or colon cancer. Negative for diabetes.   REVIEW OF SYSTEMS:   CONSTITUTIONAL: No fever or change in weight.  EYES: No blurred or double vision. No glaucoma.  ENT: No tinnitus or hearing loss. No nasal discharge or bleeding. No difficulty swallowing.  RESPIRATORY: No cough or wheezing. Denies  hemoptysis.  CARDIOVASCULAR: No chest pain or palpitations. Does have orthopnea. No syncope.  GASTROINTESTINAL: No nausea, vomiting, or diarrhea. No abdominal pain. No change in bowel habits.  GENITOURINARY: No dysuria or hematuria. No incontinence.  ENDOCRINE: No polyuria or polydipsia. No heat or cold intolerance.  HEMATOLOGIC: The patient denies anemia, easy bruising, or bleeding.  LYMPHATIC: No swollen glands.  MUSCULOSKELETAL: The patient denies pain in his neck, back, shoulders, knees, hips. No gout.  NEUROLOGICAL: No numbness or migraines. Denies seizures. Does have weakness.  PSYCHIATRIC: The patient denies anxiety, insomnia, or depression.   PHYSICAL EXAMINATION: GENERAL: The patient is acutely ill-appearing, in moderate distress.  VITAL SIGNS: Vital signs are remarkable for a blood pressure of 102/63, with a heart rate of 110, and a respiratory rate of 20. He is afebrile.  HEENT: Normocephalic, atraumatic. Pupils are equally round and reactive to light and accommodation. Extraocular movements are intact. Sclerae are anicteric. Conjunctivae are clear. Oropharynx is clear.  NECK: Supple with JVD noted to the angle of the jaw. No adenopathy or thyromegaly is present.  LUNGS: Rales approximately one-third the way up bilaterally. No wheezes or rhonchi. No dullness. Respiratory effort is increased.  CARDIAC: Irregularly irregular rhythm. No significant rubs or gallops noted.  ABDOMEN: Soft, nontender, with normoactive bowel sounds. No organomegaly or masses were appreciated. No hernias or bruits were noted.  EXTREMITIES: 1+ edema with stasis changes. Pulses were  2+ bilaterally.  SKIN: Warm and dry without rash or lesions.  NEUROLOGIC: Cranial nerves II through XII are grossly intact. Deep tendon reflexes were symmetric. Motor and  sensory exam is nonfocal.  PSYCHIATRIC: Exam revealed a patient who was alert and oriented to person, place, and time. He was cooperative and used good judgment.    LABORATORY, DIAGNOSTIC AND RADIOLOGICAL DATA:  Pro time was 41.5 with an INR of 4.4. His troponin was elevated at 1.4. BNP was 51,872. Glucose 139, with a BUN of 43, and a creatinine of 2.74, with a sodium of 134 and a potassium of 5.2, with a bilirubin of 1.8, and an ALT of 136, and an AST of 182, and a GFR of 25. White count was 3.9 with a hemoglobin of 16.1.   Chest x-ray revealed CHF. EKG revealed a paced rhythm at 110 beats per minute.   ASSESSMENT: 1. Acute respiratory failure.  2. Acute on chronic systolic congestive heart failure.  3. Acute on chronic renal failure.  4. Dilated cardiomyopathy with ejection fraction of 10%.  5. Chronic atrial fibrillation.  6. Hyperglycemia.  7. Abnormal LFTs of unclear etiology.  8. Elevated troponin.  9. Coagulopathy, Coumadin-induced.   PLAN: The patient will be admitted to telemetry as a FULL CODE. We will begin IV Lasix for diuresis, and double up his dose of Coreg, and add Lanoxin for better rate control. We will hold his Coumadin at this time because of his INR of 4.4. We will follow serial cardiac enzymes and supplement oxygen at this time. We will follow his sugars with Accu-Cheks before meals and at bedtime. We will obtain an abdominal ultrasound because of his abnormal LFTs. We will consult Nephrology and Cardiology because of the patient's renal and heart failures. We will not repeat an echo at this time as the patient had a recent echocardiogram done. His prognosis is extremely poor. We will consult Palliative Care because of this. Follow-up chest x-ray in the morning. Wean oxygen as tolerated. Further treatment and evaluation will depend upon the patient's progress.      TOTAL TIME SPENT: 50 minutes.  ____________________________ Duane LopeJeffrey D. Judithann SheenSparks, MD jds:cb D: 10/29/2012 16:57:26 ET T: 10/29/2012 18:02:07 ET JOB#: [id number]  cc: Duane LopeJeffrey D. Judithann SheenSparks, MD, <Dictator> Waylon Koffler Rodena Medin Kynlie Jane MD ELECTRONICALLY SIGNED 10/30/2012 8:04

## 2015-02-07 NOTE — Consult Note (Signed)
DATE OF BIRTH:  [birth year]  DATE OF CONSULTATION:  10/19/2012  CONSULTING PHYSICIAN:  Corky DownsJaved Kathie Posa, MD  HISTORY OF PRESENT ILLNESS:  The patient was admitted into the hospital with congestive heart failure. The patient is known to have cardiomyopathy with a low ejection fraction of 10%. He had an AICD placed about 3 months ago. Recently during the Christmastime, he ate some sausage and some food from the outside. Normally, he follows his diet very well. Now, he complains of shortness of breath on exertion, cannot do his household activities, so admitted into hospital for treatment of the congestive heart failure. He had an extensive cardiac workup done in the past.   PAST MEDICAL HISTORY:  Remarkable for cardiomyopathy, rheumatic heart disease, history of drinking alcohol, dyslipidemia, hypertension. Apparently, there is no history of heart attack.   or recent cardiac cath. The patient used to be a former smoker, quit about 12 years ago. He smoked about 20 to 30 years.   REVIEW OF SYSTEMS:  Remarkable for shortness of breath at night and with minimal exertion.   OTHER PAST MEDICAL HISTORY:  Includes history of hypertension, stage III chronic kidney disease, atrial fibrillation, dyslipidemia, mitral regurgitation, history of ischemic stroke.   PRESENT MEDICATIONS:  Aldactone 25 mg p.o. daily, Atacand 2 mg p.o. daily, Cialis 5 mg p.o. daily, Coreg 6.25 mg p.o. twice a day, Coumadin 2.5 mg p.o. daily, Lipitor 80 mg p.o. daily.   PHYSICAL EXAMINATION:  GENERAL:  The patient is an alert, well-nourished black male. He was short of breath on minimal exertion.  HEART:  Irregular.  CHEST:  Bilateral rales at the bases.  ABDOMEN:  Soft, nontender.  EXTREMITIES:  There is 1+ pedal edema.  NEUROLOGIC:  Examination is intact.   LABORATORY AND RADIOLOGIC DATA:  BNP 19,878, glucose 177, creatinine 2.16, GFR 33, albumin 3.1, SGOT 60. WBC count 3500, pro time 21.4. CPK is 782, CK-MB 11.3, troponin 0.09.  Troponin is borderline elevated. Chest x-ray shows atelectasis in both lower lobes with atypical congestive heart failure.   IMPRESSION:  Congestive heart failure, rule out intercurrent pneumonia, cardiomyopathy with low ejection fraction of 10% secondary to rheumatic heart disease, hypertension and heavy drinking, chronic obstructive pulmonary disease, automatic implantable cardioverter defibrillator placed on the left side because of the low ejection fraction. The patient admits having a high salt diet recently. Acute-on-chronic renal failure, elevated troponin.   RECOMMENDATIONS:  I would suggest to start the patient  on ranexa 500 mg p.o. daily to see if he tolerates it. He is on low dosage of beta blocker, also on Coumadin and Atacand and Aldactone. We will get the dietitian involved to teach him on 2-gram sodium diet. He is not having any chest pain. His creatinine is elevated. He will go into renal failure with angiogram, so suggest conservative medical treatment at the present time. If his creatinine comes down to 1.2 or 1.3, then cath can be done to look at the coronary arteries.     ____________________________ Corky DownsJaved Jamus Loving, MD jm:ms D: 10/19/2012 19:52:43 ET T: 10/19/2012 21:22:47 ET JOB#: [id number]  cc: Corky DownsJaved Stein Windhorst, MD, <Dictator> Corky DownsJAVED Garlin Batdorf MD ELECTRONICALLY SIGNED 10/20/2012 19:05

## 2015-02-07 NOTE — Discharge Summary (Signed)
PATIENT NAME:  [name], [name] MR#:  [id number] DATE OF BIRTH:  03/02/1934  DATE OF ADMISSION:  10/20/2012 DATE OF DISCHARGE:  10/23/2012  For a detailed note, please take a look at the history and physical done on admission by Dr. Letitia Libra.   DIAGNOSES AT DISCHARGE: 1.  Acute on chronic congestive heart failure, likely systolic dysfunction. 2.  Chronic kidney disease, stage III. 3.  History of nonischemic cardiomyopathy, ejection fraction of less than 25%. 4.  History of chronic atrial fibrillation.   DIET: The patient is discharged on a low sodium, low fat diet.   ACTIVITY: As tolerated.   FOLLOW-UP:  With Dr. Adrian Blackwater in the next 1 to 2 weeks.   DISCHARGE MEDICATIONS:  Lipitor 80 mg at bedtime.  warfarin 2.5 mg daily. Cialis 5 mg as needed. Aldactone 25 mg daily. Coreg 3.125 mg b.i.d.  Lasix 20 mg daily.  Ranexa 500 mg daily.   CONSULTANTS DURING THE HOSPITAL COURSE:   Scherrie Merritts, MD from cardiology.   PERTINENT STUDIES DONE DURING THE HOSPITAL COURSE:  A chest x-ray done on admission showing bilateral lower lobe pneumonia versus atelectasis and small bilateral effusions, atypical congestive heart failure is not completely excluded. An ultrasound of the abdomen showing multiple non-mobile gallstones within the gallbladder consistent with gallbladder wall thickening secondary to edema. Negative Murphy's sign. No evidence of pericholecystic fluid. Bilateral pleural effusions.   BRIEF HOSPITAL COURSE:  This is a 81 year old male with medical problems as mentioned above who presented to the hospital with shortness of breath and noted to be in acute on chronic congestive heart failure.   Problem 1.  Acute on chronic congestive heart failure. This was likely secondary to acute on chronic systolic dysfunction. The patient has a severe cardiomyopathy with an EF of less than 25%. The patient was diuresed with IV Lasix and his clinical symptoms have significantly improved since admission.  He was able to ambulate and did not require any oxygen and he clinically felt much better. He was only diuresis with 20 mg of IV Lasix b.i.d., given his hypotension from his cardiomyopathy. The patient presently is being discharged on his Lasix along with beta blocker. An ACE inhibitor was not prescribed as he was hypotensive plus he has some chronic kidney disease.   Problem 2.  Chronic kidney disease, stage III.  The patient's baseline creatinine is around 1.9. When he was admitted his creatinine was 2.16. His creatinine did come down to his baseline at 1.9. He responded well to IV Lasix. Again, he was on an ARB prior to coming in but it was discontinued due to significant hypotension. Further follow up of his renal function can be done as an outpatient.   Problem 3.  Elevated troponin. This was likely in the setting of demand ischemia from CHF and no evidence of acute coronary syndrome. The patient was seen by Dr. Juel Burrow from cardiology  who also did not think that his troponin elevation was consistent with acute coronary syndrome.   Problem 4.  Abnormal LFTs. This was likely secondary to chronic passive congestion from his congestive heart failure. He underwent an abdominal ultrasound which showed multiple gallstones but no pericholecystic fluid. No evidence of acute cholecystitis. The patient did not have any abdominal pain, nausea, vomiting or fever. This can be further followed up as an outpatient.   Problem 5.  History of chronic atrial fibrillation. The patient remained rate controlled in the hospital on his Coreg which he will continue. He was  also maintained on his warfarin. His INR was therapeutic upon discharge.   Problem 6.  Hyperlipidemia. The patient was maintained on his Lipitor and he will resume that upon discharge.   CODE STATUS: The patient is a full code.   Time spent with discharge: 40 minutes.     ____________________________ Rolly PancakeVivek J. Cherlynn KaiserSainani, MD vjs:ct D: 10/23/2012  15:13:51 ET T: 10/24/2012 07:38:17 ET JOB#: [id number]  cc: Rolly PancakeVivek J. Cherlynn KaiserSainani, MD, <Dictator> Laurier NancyShaukat A. Khan, MD [place] SirenVIVEK J SAINANI MD ELECTRONICALLY SIGNED 10/25/2012 14:08

## 2015-02-07 NOTE — Consult Note (Signed)
Patient well known to me, has severe aortic stenosis, but renal insufficiency is new. Will do diuresis carefully. Probably no candidate for AVR as now renal insufficiency will make it difficult to do cath. But will get renal input.  Electronic Signatures: Radene KneeKhan, Shaukat Ali (MD)  (Signed on 13-Jan-14 08:58)  Authored  Last Updated: 13-Jan-14 08:58 by Radene KneeKhan, Shaukat Ali (MD)

## 2015-02-07 NOTE — Discharge Summary (Signed)
PATIENT NAME:  [name], [name] MR#:  [id number] DATE OF BIRTH:  1934-09-06  DATE OF ADMISSION:  10/29/2012 DATE OF DISCHARGE:  10/31/2012  PRESENTING COMPLAINT: Shortness of breath.   DISCHARGE DIAGNOSES: 1.  Acute on chronic systolic congestive heart failure.  2.  Severe cardiomyopathy with ejection fraction of 60%.  3.  Severe mitral regurgitation, and moderate aortic stenosis.   4.  Saturations 98% on room air.  5.  The patient will be discharged to home with hospice services.   MEDICATIONS: 1.  Lipitor 80 mg at bedtime.  2.  Cialis 5 mg p.o. daily.  3.  Aldactone 25 mg p.o. daily.  4.  Ranolazine 500 mg extended release 1 tablet every 24 hours.  5.  Lasix 20 mg p.o. daily.  6.  Coreg 6.25 mg p.o. b.i.d.   DIET: Low-sodium diet.   The patient was advised to hold off on a warfarin until his INR is therapeutic between 2 and 3. The patient will follow up at [facility] on Thursday, January 16th or Friday, January 17th for PT-INR check. The patient will follow up with Dr. Wallene Huh on 11/03/2012, at 10:50 a.m. at [facility].    FOLLOWUP: Follow up with Dr. Welton Flakes 11/02/2012, at 10:00 a.m.   CONSULTATION: Cardiology consultation with Dr. Dr. Adrian Blackwater. Nephrology consultation with Dr. Thedore Mins. Palliative care consultation Dr. Harvie Junior.   CODE STATUS: No code, DO NOT RESUSCITATE.   BRIEF SUMMARY OF HOSPITAL COURSE: The patient is a very pleasant 81 year old African American gentleman with severe cardiomyopathy, comes into the Emergency Room and got admitted with:  1. Acute on chronic systolic congestive heart failure. His EF is around 6% by most recent echo per Dr. Adrian Blackwater. He is going to severe MR, and moderate AS. The patient was started on IV Lasix; however, careful diuresis was done given his low blood pressure. He was changed to p.o. Lasix. His Coreg and Aldactone will continue.  2.  Acute on chronic renal failure. Baseline creatinine of 1.7 to 1.8, currently around 2.7.  The patient was seen by nephrology. Dr. Thedore Mins recommended to monitor creatinine closely and careful diuresis.  3.  Elevated cardiac enzymes in the setting of congestive heart failure and demand ischemia from very severe dilated cardiomyopathy. There is no evidence of acute coronary syndrome. The patient was seen by Dr. Welton Flakes, recommended continue Ranexa, statins and aspirin.  He will be seen by Dr. Welton Flakes as outpatient.  4.  Abnormal LFT, likely related to passive hepatic congestion from congestive heart failure. Ultrasound of abdomen showing gallstones, but no evidence of cholecystitis. Findings are likely related to CHF.  5. History of chronic atrial fibrillation, rate controlled. Continue low-dose Coreg. Coumadin held because of elevated INR. His INR at discharge was 4.8. No active bleeding was noted. INR was not reversed. The patient will follow up with [facility] on Friday, January 17th to get PT-INR checked and resume Coumadin according to his INR, per Dr. Jeanice Lim instructions.  Hospital stay otherwise remained stable. The patient was seen by palliative care and discussed with family. The patient is agreeable with hospice services, which has been arranged to be followed at home.    TIME SPENT: 40 minutes.     ____________________________ Wylie Hail Allena Katz, MD sap:cc D: 10/31/2012 14:59:00 ET T: 10/31/2012 20:28:41 ET JOB#: [id number]  cc: Laurier Nancy, MD Dr. Lyndal Rainbow, MD Ned Grace, MD Kalem Rockwell A. Allena Katz, MD, <Dictator>    Willow Ora MD ELECTRONICALLY  SIGNED 11/10/2012 21:30

## 2015-02-07 NOTE — Consult Note (Signed)
Brief Consult Note: Diagnosis: Acute decompensated CHF.   Patient was seen by consultant.   Consult note dictated.   Comments: 1. CHF-patient has dilated cardiomyopathy, last LVEF 18% on echo done as outpt 10/26/12 with severe diastolic dyfx. Pt had Coreg dose increased and tolerating, on dig, Lasix, Aldactone. Will continue gentle diuresis as has ARF/CKD. He is s/p AICD placement and pacing 100% of time. 2. Elevated cardiac enzymes-may be from decreased perfusion from CKD vs. ischemia. Pt CP free at this time and renal fx precludes cardiac cath. Consider outpt Lexiscan stress test 3. Chronic paroxysmal afib-continue rate control. In 70-80 HR on tele this am. Continue BB, dig, on warfarin 4. Moderate to severe MR in patient with ho rheumatic heart disease. Not candidate for valve replacement or repair at this time 5. CKD/ARF-nephrology consulted.  Electronic Signatures: Radene KneeKhan, Shaukat Ali (MD)   (Signed 13-Jan-14 08:55)  Co-Signer: Brief Consult Note Verta EllenManzi, Britt Theard A (PA-C)   (Signed 13-Jan-14 08:31)  Authored: Brief Consult Note  Last Updated: 13-Jan-14 08:55 by Radene KneeKhan, Shaukat Ali (MD)

## 2015-02-09 NOTE — Consult Note (Signed)
PATIENT NAME:  Raymond, [name]#:  [id number] DATE OF BIRTH:  [birth year]  DATE OF CONSULTATION:  03/29/2012  REFERRING PHYSICIAN:  Dr. Cherlynn Kaiser CONSULTING PHYSICIAN:  Verta Ellen, PA-C  PRIMARY CARE PHYSICIAN: [place] CARDIOLOGIST:  Dr. Rennis Harding, [place], [place].   REASON FOR CONSULTATION: Shortness of breath, congestive heart failure.   HISTORY OF PRESENT ILLNESS: Clements. Sye Schroepfer is a 81 year old white male who presented to the Emergency Room complaining of shortness of breath. He only had one episode of chest pain in the left chest that was burning, and only lasted less than 2 seconds. Pain did not radiate and he did not have any associated diaphoresis. He states that he has had extensive cardiac work-up where he lives in [place]. He does have two-pillow orthopnea but denies any dizziness, fainting, or peripheral edema. Of note, the patient has had transesophageal echocardiogram, stress testing, and other medical tests in anticipation of his automatic implantable cardiac defibrillator placement in [facility] within the last 2 to 3 months.   PAST MEDICAL HISTORY:  1. Rheumatic heart disease.  2. History of cardiomyopathy.  3. Congestive heart failure.  4. Hyperlipidemia.  5. Hypertension.   PAST SURGICAL HISTORY: No previous surgeries.   ALLERGIES: No known drug allergies.   HOME MEDICATIONS:  1. Aspirin 81 mg p.o. daily.  2. Atacand 8 mg p.o. daily.  3. Bystolic 5 mg p.o. daily.  4. Lasix 80 mg p.o. daily.  5. Potassium 20 mEq daily.  6. Magnesium oxide 400 mg daily.  7. Simvastatin 20 mg p.o. at bedtime.  8. Vitamin B12 100 mcg daily.  9. Vitamin D3 2000 international units daily.   SOCIAL HISTORY: The patient is a former smoker, quit 10 or more years ago. Has a 20 to 30 pack-year history. No previous history of alcohol abuse or illicit drug use.   FAMILY HISTORY: Father died at 39 and mother at 69, both of old age. Mother,  cerebrovascular accident.   REVIEW OF SYSTEMS:  The patient complains of shortness of breath, short episode of burning chest pain. He denies any edema, dizziness, fainting, fevers, or abdominal pain.   PHYSICAL EXAMINATION:  GENERAL: This is a pleasant African American male sitting up in bed and eating.   VITAL SIGNS: Temperature 98.1 degrees Fahrenheit, heart rate 56, respiratory rate 18, blood pressure 115/77, oxygen saturation is 99% on 2 L/min via nasal cannula.   HEENT: Head atraumatic, normocephalic. Eyes- pupils are round and equal. Conjunctivae pale pink. Ears and nose are normal to external inspection. Mouth- good dentition.   NECK: Neck is supple with no carotid bruits noted.   LUNGS: Slight fine crackles noted at the bases.  CARDIOVASCULAR: Regular rate and rhythm. Murmur is present.   ABDOMEN:  Nondistended. Bowel sounds present. Abdomen is soft and nontender to palpation.   EXTREMITIES: The patient has TED hose.   ASSESSMENT/PLAN:  1. Shortness of breath likely secondary to acute on chronic systolic heart failure. Although his exact ejection fraction is unknown, patient likely has LVEF less than 35% as he is scheduled for  automatic implantable cardiac defibrillator placement in [facility]. He has had diuresis with IV Lasix and is currently on ARB. Agree with low sodium diet.  2. Elevated troponin I, most likely from demand ischemia from acute congestive heart failure/chronic obstructive pulmonary disease. There are no acute EKG changes noted and the patient notes he has had extensive cardiac work-up in [facility]. We will  repeat echocardiogram to check wall motion and also confirm his left ventricular ejection fraction.  3. Chronic kidney disease, creatinine currently 1.84.    This case was discussed with Dr. Adrian BlackwaterShaukat Khan who agrees with the assessment and management of the patient above. We will continue to follow this patient with you. Thank you very  much for this consultation and allowing us to participate in the patient's care.    ____________________________ Verta EllenMonica A. Jhene Westmoreland, PA-C mam:bjt D: 03/29/2012 12:42:38 ET T: 03/29/2012 14:00:43 ET JOB#: [id number]  cc: Verta EllenMonica A. Purl Claytor, PA-C, <Dictator> Eliette Drumwright A Corina Stacy PA ELECTRONICALLY SIGNED 03/30/2012 13:18

## 2015-02-09 NOTE — Consult Note (Signed)
Brief Consult Note: Diagnosis: SOB, likely from CHF and elevated TNI.   Comments: Patient with no significant CP or acute EKG changes to suggest MI and elevations in TNI likely from demand ischemia from SOB associatd with cardiomyopathy, Patient with rheumatic heart disease, and presumed LVEF<35% as has been [place] on [place]07/[place]. Patient has had recent thorough cardiac evaluation with [facility]ennis [facility]Ellis [facility] which he believes has included stress test, echo, and TEE. Agree with diuretic therapy, ARBs, low Na+ diet. Repeat echo to check wall motion and confirm LVEF.  Electronic Signatures: Radene KneeKhan, Shaukat Ali (MD)   (Signed 12-Jun-13 16:24)  Co-Signer: Brief Consult Note Verta EllenManzi, Arthuro Canelo A (PA-C)   (Signed 12-Jun-13 10:22)  Authored: Brief Consult Note  Last Updated: 12-Jun-13 16:24 by Radene KneeKhan, Shaukat Ali (MD)

## 2015-02-09 NOTE — H&P (Signed)
PATIENT NAME:  [name], [name] MR#:  [id number] DATE OF BIRTH:  [birth year]  DATE OF ADMISSION:  03/28/2012  PRIMARY CARE PHYSICIAN: Located in [place]   CHIEF COMPLAINT: Shortness of breath, exertional and progressive.   HISTORY OF PRESENT ILLNESS: This is a 81 year old male who presents to the Emergency Room complaining of progressive shortness of breath for the past week. Patient describes the shortness of breath even on minimal exertion progressively getting worse over the past week or so. Patient is visiting from the [facility] area visiting a friend here but is planning on moving to this area but has not established any care here. Patient apparently is supposed to be going to get automatic implantable cardiac defibrillator placement done at [facility] coming up in the next two weeks. Since he was having progressive shortness of breath and he could not even sleep at night without becoming short of breath he came to the ER for further evaluation. Patient also complained of one episode of chest pain/pressure which he had about a week or so ago but has had no episode since then. He has had no diaphoresis. No nausea, no vomiting, no abdominal pain, no fevers, no chills, no cough and no other associated symptoms presently.   REVIEW OF SYSTEMS: CONSTITUTIONAL: No documented fever. No weight gain. No weight loss. EYES: No blurred or double vision. ENT: No tinnitus. No postnasal drip. No redness of the oropharynx. RESPIRATORY: No cough, no wheeze, no hemoptysis. Positive dyspnea. CARDIOVASCULAR: Positive chest pain. No orthopnea, no palpitations, no syncope. GASTROINTESTINAL: No nausea, no vomiting, no diarrhea, no abdominal pain, no melena, no hematochezia. GENITOURINARY: No dysuria. No hematuria. ENDOCRINE: No polyuria or nocturia. No heat or cold intolerance. HEME: No anemia. No bruising. No bleeding. INTEGUMENTARY: No rashes, no lesions. MUSCULOSKELETAL: No arthritis, no swelling, no gout. NEUROLOGIC:  No numbness, no tingling, no ataxia, no seizure-type activity. PSYCH: No anxiety, no insomnia, no ADD.   PAST MEDICAL HISTORY:  1. Rheumatic heart disease. 2. History of cardiomyopathy. 3. History of previous congestive heart failure. 4. Hyperlipidemia. 5. Hypertension.   ALLERGIES: No known drug allergies.   SOCIAL HISTORY: Used to be a smoker, quit about 10+ years ago. Does have a 20-30 pack-year smoking history. No alcohol abuse. No illicit drug abuse.   FAMILY HISTORY: Both mother and father died from old age. Father died at age of 51, mother at 55. Mother did have a stroke in the past.   CURRENT MEDICATIONS:  1. Aspirin 81 mg daily.  2. Atacand 8 mg daily.  3. Bystolic 5 mg daily.  4. Lasix 80 mg daily.  5. Potassium 20 mEq daily.  6. Magnesium oxide 400 mg daily.  7. Simvastatin 20 mg at bedtime. 8. Vitamin B12 1000 mcg daily.  9. Vitamin D3 2000 international units daily.   PHYSICAL EXAMINATION ON ADMISSION: VITAL SIGNS: Patient's vital signs are noted to be: Temperature 97.4, pulse 77, respirations 20, blood pressure 137/92, sats 96% on 2 liters nasal cannula.  GENERAL: He is a pleasant appearing male, no apparent distress.   HEENT: Atraumatic, normocephalic. Extraocular muscles are intact. Pupils equal, reactive to light. Sclerae anicteric. No conjunctival injection. No oropharyngeal erythema.   NECK: Supple. There is no jugular venous distention, no bruits, no lymphadenopathy, no thyromegaly.   HEART: Regular rate and rhythm. No murmurs. No rubs. No clicks.   LUNGS: Clear to auscultation bilaterally. No rales, no rhonchi, no wheezes.   ABDOMEN: Soft, flat, nontender, nondistended. Has good bowel sounds. No hepatosplenomegaly appreciated.  EXTREMITIES: There is no evidence of any cyanosis, clubbing, or peripheral edema. Has +2 pedal and radial pulses bilaterally.   NEUROLOGIC: Patient is alert, awake, oriented x3 with no focal motor or sensory deficits appreciated  bilaterally.   SKIN: Moist, warm with no rash appreciated.   LYMPHATIC: There is no cervical or axillary lymphadenopathy.   LABORATORY, DIAGNOSTIC, AND RADIOLOGICAL DATA: Serum glucose 114, BNP 14,000. BUN 27, creatinine 1.8, sodium 143, potassium 4.1, chloride 106, bicarbonate 30. Liver function tests are mildly elevated with AST 74, ALT 101. Albumin is normal. Troponin elevated at 0.3. CK 121, CK-MB 4.6. White cell count 4.7, hemoglobin 14.3, hematocrit 43.7, platelet count 150.   Patient did have an EKG done which shows a normal sinus rhythm with biatrial enlargement with multiple PVCs.   ASSESSMENT AND PLAN: This is a 81 year old male with a history of rheumatic heart disease, history of cardiomyopathy, congestive heart failure, hypertension, chronic kidney disease stage III, hyperlipidemia presents to the hospital with progressive shortness of breath and elevated troponin.  1. Decompensated congestive heart failure. This is the likely cause of patient's progressive shortness of breath. Although patient denies any PND, orthopnea, does not have any significant weight gain but clinically his symptoms are consistent with heart failure. He apparently is scheduled to get an automatic implantable cardiac defibrillator [place] in [place] some time later this month so I suspect his congestive heart failure is likely acute on chronic systolic dysfunction. I will try to obtain records from his cardiologist and from [facility] where he has gotten care before. For now I will place him on IV Lasix, follow strict ins and outs and daily weights. Continue his beta blocker for now. Continue Atacand. Will get a cardiology consult here and will try to obtain records from his previous cardiologist and follow him clinically.  2. Elevated troponin. This is likely in the setting of congestive heart failure and unlikely acute cardiac ischemia. Patient is currently chest pain free although I will place him on  telemetry, check serial cardiac markers. Continue aspirin, beta blocker, statin, nitroglycerin and oxygen. Get a cardiology consult as mentioned.  3. Hypertension, presently hemodynamically stable. Continue Atacand and Bystolic.  4. Chronic kidney disease, stage III. I do not have a baseline creatinine to compare with, but I will follow his BUN and creatinine as patient is going to be on diuretics.  5. Hyperlipidemia. Continue with simvastatin.  6. CODE STATUS: Patient is a FULL CODE.   TIME SPENT: 50 minutes.   ____________________________ Rolly PancakeVivek J. Cherlynn KaiserSainani, MD vjs:cms D: 03/28/2012 18:13:05 ET T: 03/29/2012 07:13:22 ET JOB#: [id number]  cc: Rolly PancakeVivek J. Cherlynn KaiserSainani, MD, <Dictator>  [place] SirenVIVEK J SAINANI MD ELECTRONICALLY SIGNED 03/30/2012 8:12

## 2015-02-09 NOTE — Discharge Summary (Signed)
PATIENT NAME:  Raymond HartsHOMAS, Dailyn J MR#:  [id number] DATE OF BIRTH:  November 03, 1933  DATE OF ADMISSION:  03/28/2012 DATE OF DISCHARGE:  03/30/2012  ADMISSION DIAGNOSIS: Acute systolic heart failure.   DISCHARGE DIAGNOSES:  1. Acute systolic heart failure.  2. Cardiomyopathy with ejection fraction of 10%.  3. Hypertension.  4. Elevated troponin.  5. Chronic kidney disease.   CONSULT: Dr. Adrian BlackwaterShaukat Khan.    LABORATORY, RADIOLOGICAL AND DIAGNOSTIC DATA: 2-D echocardiogram showed an ejection fraction of 10% with severe LV systolic dysfunction. Sodium 142, potassium 3.5, chloride 104, bicarbonate 31, BUN 28, creatinine 1.75. Glucose 104. Troponin 0.28. CK 3.6, CPK-MB 89. Troponin max 0.31. White blood cells 4.6, hemoglobin 14, hematocrit 43, platelets 135.   HOSPITAL COURSE: 81 year old male who is visiting [place] who presented with shortness of breath, found to have acute systolic heart failure. For further details, please refer to the history and physical.  1. Acute on chronic systolic heart failure, ejection fraction was 10% by echocardiogram. The patient's plan for an automatic implantable cardiac defibrillator on 04[place]07/2012 in [place]. When he moves back to [place], he will follow-up with Dr. Adrian BlackwaterShaukat Khan. Dr. Christy SartoriusShaukat Khan's consult was appreciated. The patient was diuresed with Lasix. He actually was doing quite well. He is negative almost 1,400 mL prior to discharge. His lung sounds are clear. He does not need oxygen. Advised a low sodium diet and follow-up as planned.  2. Elevated troponin due to supply/demand ischemia from acute congestive heart failure exacerbation. Troponins were stable. This is not indicative of acute coronary syndrome.  3. Chronic kidney disease, likely secondary to problem #1 which is stable.  4. Cardiomyopathy. Plan for automatic implantable cardiac defibrillator 04/07/2012 in [place] with [facility].  5. Hyperlipidemia. The patient will  continue on a statin.   DISCHARGE MEDICATIONS:  1. K-Dur 20 milliequivalents b.i.d.  2. Bystolic 5 mg daily.  3. Vitamin D3 2,000 international units daily.  4. Vitamin B12 1,000 mcg daily.  5. Lasix 80 mg daily.  6. Magnesium oxide 400 mg daily. 7. Simvastatin 20 mg at bedtime.  8. Aspirin 81 mg daily.  9. Atacand 8 mg daily.   DISCHARGE DIET: Low sodium.   DISCHARGE ACTIVITY: No exertional activity.   DISCHARGE REFERRAL: The patient will follow up with [facility] Rennis [facility]Ellis at [facility], his cardiologist, for automatic implantable cardiac defibrillator which is already planned on 04/07/2012. The patient is stable for discharge.   TIME SPENT: 35 minutes.     ____________________________ Janyth ContesSital P. Juliene PinaMody, MD spm:ap D: 03/30/2012 12:55:35 ET T: 03/30/2012 14:05:14 ET JOB#: [id number]  cc: Tregan Read P. Juliene PinaMody, MD, <Dictator> Dr. Rennis HardingEllis, [facility] Syla Devoss P Dyshon Philbin MD ELECTRONICALLY SIGNED 03/31/2012 13:34
